# Patient Record
Sex: Female | Born: 1945
Health system: Southern US, Community
[De-identification: ages and names within clinical notes are randomized; demographics above are authoritative.]

## PROBLEM LIST (undated history)

## (undated) DIAGNOSIS — G43909 Migraine, unspecified, not intractable, without status migrainosus: Secondary | ICD-10-CM

## (undated) HISTORY — PX: COLONOSCOPY: SHX174

## (undated) HISTORY — PX: APPENDECTOMY: SHX54

## (undated) HISTORY — PX: TONSILLECTOMY: SUR1361

## (undated) HISTORY — PX: ABDOMINAL HYSTERECTOMY: SHX81

---

## 2005-08-10 ENCOUNTER — Ambulatory Visit: Payer: Self-pay | Admitting: Internal Medicine

## 2006-12-06 ENCOUNTER — Ambulatory Visit: Payer: Self-pay | Admitting: Internal Medicine

## 2008-01-30 ENCOUNTER — Ambulatory Visit: Payer: Self-pay | Admitting: Internal Medicine

## 2011-07-26 ENCOUNTER — Ambulatory Visit: Payer: Self-pay | Admitting: Internal Medicine

## 2012-07-26 ENCOUNTER — Ambulatory Visit: Payer: Self-pay | Admitting: Internal Medicine

## 2013-07-30 ENCOUNTER — Ambulatory Visit: Payer: Self-pay | Admitting: Internal Medicine

## 2013-09-10 ENCOUNTER — Ambulatory Visit: Payer: Self-pay | Admitting: Gastroenterology

## 2015-08-06 ENCOUNTER — Other Ambulatory Visit: Payer: Self-pay | Admitting: Internal Medicine

## 2015-08-06 DIAGNOSIS — Z1231 Encounter for screening mammogram for malignant neoplasm of breast: Secondary | ICD-10-CM

## 2015-08-18 ENCOUNTER — Ambulatory Visit
Admission: RE | Admit: 2015-08-18 | Discharge: 2015-08-18 | Disposition: A | Discharge status: Home / Self Care | Payer: PPO | Source: Ambulatory Visit | Attending: Internal Medicine | Admitting: Internal Medicine

## 2015-08-18 DIAGNOSIS — Z1231 Encounter for screening mammogram for malignant neoplasm of breast: Secondary | ICD-10-CM | POA: Diagnosis not present

## 2016-02-05 DIAGNOSIS — J40 Bronchitis, not specified as acute or chronic: Secondary | ICD-10-CM | POA: Diagnosis not present

## 2016-07-30 DIAGNOSIS — E782 Mixed hyperlipidemia: Secondary | ICD-10-CM | POA: Diagnosis not present

## 2016-08-06 DIAGNOSIS — L570 Actinic keratosis: Secondary | ICD-10-CM | POA: Diagnosis not present

## 2016-08-06 DIAGNOSIS — Z23 Encounter for immunization: Secondary | ICD-10-CM | POA: Diagnosis not present

## 2016-08-06 DIAGNOSIS — Z0000 Encounter for general adult medical examination without abnormal findings: Secondary | ICD-10-CM | POA: Diagnosis not present

## 2016-08-06 DIAGNOSIS — R5382 Chronic fatigue, unspecified: Secondary | ICD-10-CM | POA: Diagnosis not present

## 2016-08-06 DIAGNOSIS — M810 Age-related osteoporosis without current pathological fracture: Secondary | ICD-10-CM | POA: Diagnosis not present

## 2016-09-13 DIAGNOSIS — M810 Age-related osteoporosis without current pathological fracture: Secondary | ICD-10-CM | POA: Diagnosis not present

## 2016-10-18 DIAGNOSIS — J40 Bronchitis, not specified as acute or chronic: Secondary | ICD-10-CM | POA: Diagnosis not present

## 2017-06-27 DIAGNOSIS — H25813 Combined forms of age-related cataract, bilateral: Secondary | ICD-10-CM | POA: Diagnosis not present

## 2017-08-01 DIAGNOSIS — Z0000 Encounter for general adult medical examination without abnormal findings: Secondary | ICD-10-CM | POA: Diagnosis not present

## 2017-08-01 DIAGNOSIS — E782 Mixed hyperlipidemia: Secondary | ICD-10-CM | POA: Diagnosis not present

## 2017-08-11 DIAGNOSIS — Z79899 Other long term (current) drug therapy: Secondary | ICD-10-CM | POA: Diagnosis not present

## 2017-08-11 DIAGNOSIS — E782 Mixed hyperlipidemia: Secondary | ICD-10-CM | POA: Diagnosis not present

## 2017-08-11 DIAGNOSIS — Z23 Encounter for immunization: Secondary | ICD-10-CM | POA: Diagnosis not present

## 2017-08-11 DIAGNOSIS — Z0000 Encounter for general adult medical examination without abnormal findings: Secondary | ICD-10-CM | POA: Diagnosis not present

## 2017-10-27 DIAGNOSIS — H02831 Dermatochalasis of right upper eyelid: Secondary | ICD-10-CM | POA: Diagnosis not present

## 2018-07-10 DIAGNOSIS — W108XXA Fall (on) (from) other stairs and steps, initial encounter: Secondary | ICD-10-CM | POA: Diagnosis not present

## 2018-07-10 DIAGNOSIS — M19071 Primary osteoarthritis, right ankle and foot: Secondary | ICD-10-CM | POA: Diagnosis not present

## 2018-07-10 DIAGNOSIS — S99921A Unspecified injury of right foot, initial encounter: Secondary | ICD-10-CM | POA: Diagnosis not present

## 2018-07-11 DIAGNOSIS — S92424A Nondisplaced fracture of distal phalanx of right great toe, initial encounter for closed fracture: Secondary | ICD-10-CM | POA: Diagnosis not present

## 2018-08-09 DIAGNOSIS — Z79899 Other long term (current) drug therapy: Secondary | ICD-10-CM | POA: Diagnosis not present

## 2018-08-09 DIAGNOSIS — E782 Mixed hyperlipidemia: Secondary | ICD-10-CM | POA: Diagnosis not present

## 2018-08-16 DIAGNOSIS — E782 Mixed hyperlipidemia: Secondary | ICD-10-CM | POA: Diagnosis not present

## 2018-08-16 DIAGNOSIS — Z23 Encounter for immunization: Secondary | ICD-10-CM | POA: Diagnosis not present

## 2018-08-16 DIAGNOSIS — Z0000 Encounter for general adult medical examination without abnormal findings: Secondary | ICD-10-CM | POA: Diagnosis not present

## 2019-03-07 DIAGNOSIS — J0100 Acute maxillary sinusitis, unspecified: Secondary | ICD-10-CM | POA: Diagnosis not present

## 2019-08-13 DIAGNOSIS — E782 Mixed hyperlipidemia: Secondary | ICD-10-CM | POA: Diagnosis not present

## 2019-08-20 DIAGNOSIS — E782 Mixed hyperlipidemia: Secondary | ICD-10-CM | POA: Diagnosis not present

## 2019-08-20 DIAGNOSIS — Z23 Encounter for immunization: Secondary | ICD-10-CM | POA: Diagnosis not present

## 2019-08-20 DIAGNOSIS — Z0000 Encounter for general adult medical examination without abnormal findings: Secondary | ICD-10-CM | POA: Diagnosis not present

## 2019-09-10 DIAGNOSIS — Z20828 Contact with and (suspected) exposure to other viral communicable diseases: Secondary | ICD-10-CM | POA: Diagnosis not present

## 2019-11-16 DIAGNOSIS — U071 COVID-19: Secondary | ICD-10-CM

## 2019-11-16 HISTORY — DX: COVID-19: U07.1

## 2019-11-22 DIAGNOSIS — Z20828 Contact with and (suspected) exposure to other viral communicable diseases: Secondary | ICD-10-CM | POA: Diagnosis not present

## 2019-11-25 DIAGNOSIS — U071 COVID-19: Secondary | ICD-10-CM | POA: Diagnosis not present

## 2019-11-25 DIAGNOSIS — R509 Fever, unspecified: Secondary | ICD-10-CM | POA: Diagnosis not present

## 2020-01-23 DIAGNOSIS — J329 Chronic sinusitis, unspecified: Secondary | ICD-10-CM | POA: Diagnosis not present

## 2020-03-07 DIAGNOSIS — R05 Cough: Secondary | ICD-10-CM | POA: Diagnosis not present

## 2020-03-07 DIAGNOSIS — J45902 Unspecified asthma with status asthmaticus: Secondary | ICD-10-CM | POA: Diagnosis not present

## 2020-03-07 DIAGNOSIS — J40 Bronchitis, not specified as acute or chronic: Secondary | ICD-10-CM | POA: Diagnosis not present

## 2020-03-07 DIAGNOSIS — B349 Viral infection, unspecified: Secondary | ICD-10-CM | POA: Diagnosis not present

## 2020-03-17 DIAGNOSIS — G43009 Migraine without aura, not intractable, without status migrainosus: Secondary | ICD-10-CM | POA: Diagnosis not present

## 2020-03-17 DIAGNOSIS — R509 Fever, unspecified: Secondary | ICD-10-CM | POA: Diagnosis not present

## 2020-03-17 DIAGNOSIS — J189 Pneumonia, unspecified organism: Secondary | ICD-10-CM | POA: Diagnosis not present

## 2020-06-16 ENCOUNTER — Other Ambulatory Visit: Payer: Self-pay | Admitting: Internal Medicine

## 2020-06-16 DIAGNOSIS — Z1231 Encounter for screening mammogram for malignant neoplasm of breast: Secondary | ICD-10-CM

## 2020-07-07 ENCOUNTER — Ambulatory Visit
Admission: RE | Admit: 2020-07-07 | Discharge: 2020-07-07 | Disposition: A | Discharge status: Home / Self Care | Payer: PPO | Source: Ambulatory Visit | Attending: Internal Medicine | Admitting: Internal Medicine

## 2020-07-07 ENCOUNTER — Other Ambulatory Visit: Payer: Self-pay

## 2020-07-07 DIAGNOSIS — Z1231 Encounter for screening mammogram for malignant neoplasm of breast: Secondary | ICD-10-CM | POA: Insufficient documentation

## 2020-07-08 DIAGNOSIS — L821 Other seborrheic keratosis: Secondary | ICD-10-CM | POA: Diagnosis not present

## 2020-07-08 DIAGNOSIS — D2271 Melanocytic nevi of right lower limb, including hip: Secondary | ICD-10-CM | POA: Diagnosis not present

## 2020-07-08 DIAGNOSIS — L538 Other specified erythematous conditions: Secondary | ICD-10-CM | POA: Diagnosis not present

## 2020-07-08 DIAGNOSIS — D225 Melanocytic nevi of trunk: Secondary | ICD-10-CM | POA: Diagnosis not present

## 2020-07-08 DIAGNOSIS — D2272 Melanocytic nevi of left lower limb, including hip: Secondary | ICD-10-CM | POA: Diagnosis not present

## 2020-07-08 DIAGNOSIS — D2262 Melanocytic nevi of left upper limb, including shoulder: Secondary | ICD-10-CM | POA: Diagnosis not present

## 2020-07-08 DIAGNOSIS — D2261 Melanocytic nevi of right upper limb, including shoulder: Secondary | ICD-10-CM | POA: Diagnosis not present

## 2020-07-08 DIAGNOSIS — L820 Inflamed seborrheic keratosis: Secondary | ICD-10-CM | POA: Diagnosis not present

## 2020-08-13 DIAGNOSIS — E782 Mixed hyperlipidemia: Secondary | ICD-10-CM | POA: Diagnosis not present

## 2020-08-20 DIAGNOSIS — Z0000 Encounter for general adult medical examination without abnormal findings: Secondary | ICD-10-CM | POA: Diagnosis not present

## 2020-08-20 DIAGNOSIS — Z23 Encounter for immunization: Secondary | ICD-10-CM | POA: Diagnosis not present

## 2020-08-20 DIAGNOSIS — E782 Mixed hyperlipidemia: Secondary | ICD-10-CM | POA: Diagnosis not present

## 2020-10-29 DIAGNOSIS — J329 Chronic sinusitis, unspecified: Secondary | ICD-10-CM | POA: Diagnosis not present

## 2020-10-29 DIAGNOSIS — J3489 Other specified disorders of nose and nasal sinuses: Secondary | ICD-10-CM | POA: Diagnosis not present

## 2020-10-29 DIAGNOSIS — J309 Allergic rhinitis, unspecified: Secondary | ICD-10-CM | POA: Diagnosis not present

## 2020-11-05 DIAGNOSIS — J301 Allergic rhinitis due to pollen: Secondary | ICD-10-CM | POA: Diagnosis not present

## 2020-11-19 DIAGNOSIS — H903 Sensorineural hearing loss, bilateral: Secondary | ICD-10-CM | POA: Diagnosis not present

## 2020-11-19 DIAGNOSIS — J3489 Other specified disorders of nose and nasal sinuses: Secondary | ICD-10-CM | POA: Diagnosis not present

## 2020-11-19 DIAGNOSIS — J329 Chronic sinusitis, unspecified: Secondary | ICD-10-CM | POA: Diagnosis not present

## 2020-11-27 ENCOUNTER — Encounter: Payer: Self-pay | Admitting: Unknown Physician Specialty

## 2020-11-27 ENCOUNTER — Other Ambulatory Visit: Payer: Self-pay

## 2020-12-02 NOTE — Discharge Instructions (Signed)
[facility] ENDOSCOPIC SINUS SURGERY [facility]  What is Functional Endoscopic Sinus Surgery?  The Surgery involves making the natural openings of the sinuses larger by removing the bony partitions that separate the sinuses from the nasal cavity.  The natural sinus lining is preserved as much as possible to allow the sinuses to resume normal function after the surgery.  In some patients nasal polyps (excessively swollen lining of the sinuses) may be removed to relieve obstruction of the sinus openings.  The surgery is performed through the nose using lighted scopes, which eliminates the need for incisions on the face.  A septoplasty is a different procedure which is sometimes performed with sinus surgery.  It involves straightening the boy partition that separates the two sides of your nose.  A crooked or deviated septum may need repair if is obstructing the sinuses or nasal airflow.  Turbinate reduction is also often performed during sinus surgery.  The turbinates are bony proturberances from the side walls of the nose which swell and can obstruct the nose in patients with sinus and allergy problems.  Their size can be surgically reduced to help relieve nasal obstruction.  What Can Sinus Surgery Do For Me?  Sinus surgery can reduce the frequency of sinus infections requiring antibiotic treatment.  This can provide improvement in nasal congestion, post-nasal drainage, facial pressure and nasal obstruction.  Surgery will NOT prevent you from ever having an infection again, so it usually only for patients who get infections 4 or more times yearly requiring antibiotics, or for infections that do not clear with antibiotics.  It will not cure nasal allergies, so patients with allergies may still require medication to treat their allergies after surgery. Surgery may improve headaches related to sinusitis, however, some people will continue to  require medication to control sinus headaches related to allergies.  Surgery will do nothing for other forms of headache (migraine, tension or cluster).  What Are the Risks of Endoscopic Sinus Surgery?  Current techniques allow surgery to be performed safely with little risk, however, there are rare complications that patients should be aware of.  Because the sinuses are located around the eyes, there is risk of eye injury, including blindness, though again, this would be quite rare. This is usually a result of bleeding behind the eye during surgery, which puts the vision oat risk, though there are treatments to protect the vision and prevent permanent disrupted by surgery causing a leak of the spinal fluid that surrounds the brain.  More serious complications would include bleeding inside the brain cavity or damage to the brain.  Again, all of these complications are uncommon, and spinal fluid leaks can be safely managed surgically if they occur.  The most common complication of sinus surgery is bleeding from the nose, which may require packing or cauterization of the nose.  Continued sinus have polyps may experience recurrence of the polyps requiring revision surgery.  Alterations of sense of smell or injury to the tear ducts are also rare complications.   What is the Surgery Like, and what is the Recovery?  The Surgery usually takes a couple of hours to perform, and is usually performed under a general anesthetic (completely asleep).  Patients are usually discharged home after a couple of hours.  Sometimes during surgery it is necessary to pack the nose to control bleeding, and the packing is left in place for 24 - 48 hours, and removed by your surgeon.    If a septoplasty was performed during the procedure, there is often a splint placed which must be removed after 5-7 days.   Discomfort: Pain is usually mild to moderate, and can be controlled by prescription pain medication or acetaminophen (Tylenol).   Aspirin, Ibuprofen (Advil, Motrin), or Naprosyn (Aleve) should be avoided, as they can cause increased bleeding.  Most patients feel sinus pressure like they have a bad head cold for several days.  Sleeping with your head elevated can help reduce swelling and facial pressure, as can ice packs over the face.  A humidifier may be helpful to keep the mucous and blood from drying in the nose.   Diet: There are no specific diet restrictions, however, you should generally start with clear liquids and a light diet of bland foods because the anesthetic can cause some nausea.  Advance your diet depending on how your stomach feels.  Taking your pain medication with food will often help reduce stomach upset which pain medications can cause.  Nasal Saline Irrigation: It is important to remove blood clots and dried mucous from the nose as it is healing.  This is done by having you irrigate the nose at least 3 - 4 times daily with a salt water solution.  We recommend using NeilMed Sinus Rinse (available at the drug store).  Fill the squeeze bottle with the solution, bend over a sink, and insert the tip of the squeeze bottle into the nose  of an inch.  Point the tip of the squeeze bottle towards the inside corner of the eye on the same side your irrigating.  Squeeze the bottle and gently irrigate the nose.  If you bend forward as you do this, most of the fluid will flow back out of the nose, instead of down your throat.   The solution should be warm, near body temperature, when you irrigate.   Each time you irrigate, you should use a full squeeze bottle.   Note that if you are instructed to use Nasal Steroid Sprays at any time after your surgery, irrigate with saline BEFORE using the steroid spray, so you do not wash it all out of the nose. Another product, Nasal Saline Gel (such as AYR Nasal Saline Gel) can be applied in each nostril 3 - 4 times daily to moisture the nose and reduce scabbing or crusting.  Bleeding:   Bloody drainage from the nose can be expected for several days, and patients are instructed to irrigate their nose frequently with salt water to help remove mucous and blood clots.  The drainage may be dark red or brown, though some fresh blood may be seen intermittently, especially after irrigation.  Do not blow you nose, as bleeding may occur. If you must sneeze, keep your mouth open to allow air to escape through your mouth.  If heavy bleeding occurs: Irrigate the nose with saline to rinse out clots, then spray the nose 3 - 4 times with Afrin Nasal Decongestant Spray.  The spray will constrict the blood vessels to slow bleeding.  Pinch the lower half of your nose shut to apply pressure, and lay down with your head elevated.  Ice packs over the nose may help as well. If bleeding persists despite these measures, you should notify your doctor.  Do not use the Afrin routinely to control nasal congestion after surgery, as it can result in worsening congestion and may affect healing.     Activity: Return to work varies among patients. Most patients will be   out of work at least 5 - 7 days to recover.  Patient may return to work after they are off of narcotic pain medication, and feeling well enough to perform the functions of their job.  Patients must avoid heavy lifting (over 10 pounds) or strenuous physical for 2 weeks after surgery, so your employer may need to assign you to light duty, or keep you out of work longer if light duty is not possible.  NOTE: you should not drive, operate dangerous machinery, do any mentally demanding tasks or make any important legal or financial decisions while on narcotic pain medication and recovering from the general anesthetic.    Call Your Doctor Immediately if You Have Any of the Following: 1. Bleeding that you cannot control with the above measures 2. Loss of vision, double vision, bulging of the eye or black eyes. 3. Fever over 101 degrees 4. Neck stiffness with  severe headache, fever, nausea and change in mental state. You are always encourage to call anytime with concerns, however, please call with requests for pain medication refills during office hours.  Office Endoscopy: During follow-up visits your doctor will remove any packing or splints that may have been placed and evaluate and clean your sinuses endoscopically.  Topical anesthetic will be used to make this as comfortable as possible, though you may want to take your pain medication prior to the visit.  How often this will need to be done varies from patient to patient.  After complete recovery from the surgery, you may need follow-up endoscopy from time to time, particularly if there is concern of recurrent infection or nasal polyps.  General Anesthesia, Adult, Care After This sheet gives you information about how to care for yourself after your procedure. Your health care provider may also give you more specific instructions. If you have problems or questions, contact your health care provider. What can I expect after the procedure? After the procedure, the following side effects are common:  Pain or discomfort at the IV site.  Nausea.  Vomiting.  Sore throat.  Trouble concentrating.  Feeling cold or chills.  Feeling weak or tired.  Sleepiness and fatigue.  Soreness and body aches. These side effects can affect parts of the body that were not involved in surgery. Follow these instructions at home: For the time period you were told by your health care provider:  Rest.  Do not participate in activities where you could fall or become injured.  Do not drive or use machinery.  Do not drink alcohol.  Do not take sleeping pills or medicines that cause drowsiness.  Do not make important decisions or sign legal documents.  Do not take care of children on your own.   Eating and drinking  Follow any instructions from your health care provider about eating or drinking  restrictions.  When you feel hungry, start by eating small amounts of foods that are soft and easy to digest (bland), such as toast. Gradually return to your regular diet.  Drink enough fluid to keep your urine pale yellow.  If you vomit, rehydrate by drinking water, juice, or clear broth. General instructions  If you have sleep apnea, surgery and certain medicines can increase your risk for breathing problems. Follow instructions from your health care provider about wearing your sleep device: ? Anytime you are sleeping, including during daytime naps. ? While taking prescription pain medicines, sleeping medicines, or medicines that make you drowsy.  Have a responsible adult stay with you for the  time you are told. It is important to have someone help care for you until you are awake and alert.  Return to your normal activities as told by your health care provider. Ask your health care provider what activities are safe for you.  Take over-the-counter and prescription medicines only as told by your health care provider.  If you smoke, do not smoke without supervision.  Keep all follow-up visits as told by your health care provider. This is important. Contact a health care provider if:  You have nausea or vomiting that does not get better with medicine.  You cannot eat or drink without vomiting.  You have pain that does not get better with medicine.  You are unable to pass urine.  You develop a skin rash.  You have a fever.  You have redness around your IV site that gets worse. Get help right away if:  You have difficulty breathing.  You have chest pain.  You have blood in your urine or stool, or you vomit blood. Summary  After the procedure, it is common to have a sore throat or nausea. It is also common to feel tired.  Have a responsible adult stay with you for the time you are told. It is important to have someone help care for you until you are awake and  alert.  When you feel hungry, start by eating small amounts of foods that are soft and easy to digest (bland), such as toast. Gradually return to your regular diet.  Drink enough fluid to keep your urine pale yellow.  Return to your normal activities as told by your health care provider. Ask your health care provider what activities are safe for you. This information is not intended to replace advice given to you by your health care provider. Make sure you discuss any questions you have with your health care provider. Document Revised: 07/17/2020 Document Reviewed: 02/14/2020 Elsevier Patient Education  2021 [employer].

## 2020-12-03 ENCOUNTER — Other Ambulatory Visit: Payer: Self-pay

## 2020-12-03 ENCOUNTER — Other Ambulatory Visit
Admission: RE | Admit: 2020-12-03 | Discharge: 2020-12-03 | Disposition: A | Discharge status: Home / Self Care | Payer: PPO | Source: Ambulatory Visit | Attending: Unknown Physician Specialty | Admitting: Unknown Physician Specialty

## 2020-12-03 DIAGNOSIS — Z01812 Encounter for preprocedural laboratory examination: Secondary | ICD-10-CM | POA: Diagnosis not present

## 2020-12-03 DIAGNOSIS — Z20822 Contact with and (suspected) exposure to covid-19: Secondary | ICD-10-CM | POA: Insufficient documentation

## 2020-12-03 LAB — SARS CORONAVIRUS 2 (TAT 6-24 HRS): SARS Coronavirus 2: NEGATIVE

## 2020-12-05 ENCOUNTER — Encounter
Admission: RE | Disposition: A | Discharge status: Home / Self Care | Payer: Self-pay | Source: Home / Self Care | Attending: Unknown Physician Specialty

## 2020-12-05 ENCOUNTER — Encounter: Payer: Self-pay | Admitting: Unknown Physician Specialty

## 2020-12-05 ENCOUNTER — Ambulatory Visit
Admission: RE | Admit: 2020-12-05 | Discharge: 2020-12-05 | Disposition: A | Discharge status: Home / Self Care | Payer: PPO | Attending: Unknown Physician Specialty | Admitting: Unknown Physician Specialty

## 2020-12-05 ENCOUNTER — Ambulatory Visit: Payer: PPO | Admitting: Anesthesiology

## 2020-12-05 ENCOUNTER — Other Ambulatory Visit: Payer: Self-pay

## 2020-12-05 DIAGNOSIS — J3489 Other specified disorders of nose and nasal sinuses: Secondary | ICD-10-CM | POA: Diagnosis not present

## 2020-12-05 DIAGNOSIS — J343 Hypertrophy of nasal turbinates: Secondary | ICD-10-CM | POA: Insufficient documentation

## 2020-12-05 DIAGNOSIS — J342 Deviated nasal septum: Secondary | ICD-10-CM | POA: Insufficient documentation

## 2020-12-05 HISTORY — PX: NASAL SEPTOPLASTY W/ TURBINOPLASTY: SHX2070

## 2020-12-05 HISTORY — DX: Migraine, unspecified, not intractable, without status migrainosus: G43.909

## 2020-12-05 SURGERY — SEPTOPLASTY, NOSE, WITH NASAL TURBINATE REDUCTION
Anesthesia: General | Site: Nose | Laterality: Bilateral

## 2020-12-05 MED ORDER — SULFAMETHOXAZOLE-TRIMETHOPRIM 800-160 MG PO TABS: 1.0000 | ORAL_TABLET | Freq: Two times a day (BID) | ORAL | 0 refills | Status: AC

## 2020-12-05 MED ORDER — MIDAZOLAM HCL 5 MG/5ML IJ SOLN
INTRAMUSCULAR | Status: DC | PRN
  Administered 2020-12-05: 1 mg via INTRAVENOUS

## 2020-12-05 MED ORDER — FENTANYL CITRATE (PF) 100 MCG/2ML IJ SOLN
25.0000 ug | INTRAMUSCULAR | Status: DC | PRN
  Administered 2020-12-05 (×2): 25 ug via INTRAVENOUS

## 2020-12-05 MED ORDER — GLYCOPYRROLATE 0.2 MG/ML IJ SOLN
INTRAMUSCULAR | Status: DC | PRN
  Administered 2020-12-05: .1 mg via INTRAVENOUS

## 2020-12-05 MED ORDER — EPHEDRINE SULFATE 50 MG/ML IJ SOLN
INTRAMUSCULAR | Status: DC | PRN
  Administered 2020-12-05 (×2): 5 mg via INTRAVENOUS

## 2020-12-05 MED ORDER — LIDOCAINE HCL (CARDIAC) PF 100 MG/5ML IV SOSY
PREFILLED_SYRINGE | INTRAVENOUS | Status: DC | PRN
  Administered 2020-12-05: 40 mg via INTRAVENOUS

## 2020-12-05 MED ORDER — ACETAMINOPHEN 10 MG/ML IV SOLN
1000.0000 mg | Freq: Once | INTRAVENOUS | Status: AC
  Administered 2020-12-05: 1000 mg via INTRAVENOUS

## 2020-12-05 MED ORDER — PROPOFOL 10 MG/ML IV BOLUS
INTRAVENOUS | Status: DC | PRN
  Administered 2020-12-05: 130 mg via INTRAVENOUS

## 2020-12-05 MED ORDER — OXYCODONE HCL 5 MG PO TABS: 5.0000 mg | ORAL_TABLET | Freq: Once | ORAL | Status: AC | PRN

## 2020-12-05 MED ORDER — FENTANYL CITRATE (PF) 100 MCG/2ML IJ SOLN
INTRAMUSCULAR | Status: DC | PRN
  Administered 2020-12-05 (×2): 25 ug via INTRAVENOUS
  Administered 2020-12-05: 50 ug via INTRAVENOUS

## 2020-12-05 MED ORDER — SUCCINYLCHOLINE CHLORIDE 20 MG/ML IJ SOLN
INTRAMUSCULAR | Status: DC | PRN
  Administered 2020-12-05: 80 mg via INTRAVENOUS

## 2020-12-05 MED ORDER — ONDANSETRON HCL 4 MG/2ML IJ SOLN
INTRAMUSCULAR | Status: DC | PRN
  Administered 2020-12-05: 4 mg via INTRAVENOUS

## 2020-12-05 MED ORDER — DEXAMETHASONE SODIUM PHOSPHATE 4 MG/ML IJ SOLN
INTRAMUSCULAR | Status: DC | PRN
  Administered 2020-12-05: 10 mg via INTRAVENOUS

## 2020-12-05 MED ORDER — BACITRACIN 500 UNIT/GM EX OINT
TOPICAL_OINTMENT | CUTANEOUS | Status: DC | PRN
  Administered 2020-12-05: 1 via TOPICAL

## 2020-12-05 MED ORDER — OXYCODONE HCL 5 MG/5ML PO SOLN
5.0000 mg | Freq: Once | ORAL | Status: AC | PRN
  Administered 2020-12-05: 5 mg via ORAL

## 2020-12-05 MED ORDER — PHENYLEPHRINE HCL 0.5 % NA SOLN
NASAL | Status: DC | PRN
  Administered 2020-12-05: 30 mL via TOPICAL

## 2020-12-05 MED ORDER — ONDANSETRON HCL 4 MG/2ML IJ SOLN: 4.0000 mg | Freq: Once | INTRAMUSCULAR | Status: DC | PRN

## 2020-12-05 MED ORDER — OXYMETAZOLINE HCL 0.05 % NA SOLN: 6.0000 | Freq: Two times a day (BID) | NASAL | Status: DC

## 2020-12-05 MED ORDER — LIDOCAINE-EPINEPHRINE 1 %-1:100000 IJ SOLN
INTRAMUSCULAR | Status: DC | PRN
  Administered 2020-12-05: 12 mL

## 2020-12-05 MED ORDER — LACTATED RINGERS IV SOLN: INTRAVENOUS | Status: DC

## 2020-12-05 MED ORDER — HYDROCODONE-ACETAMINOPHEN 5-325 MG PO TABS: 1.0000 | ORAL_TABLET | Freq: Four times a day (QID) | ORAL | 0 refills | Status: AC | PRN

## 2020-12-05 SURGICAL SUPPLY — 24 items
COAG SUCT 10F 3.5MM HAND CTRL (MISCELLANEOUS) ×2 IMPLANT
DRAPE HEAD BAR (DRAPES) ×2 IMPLANT
DRESSING NASL FOAM PST OP SINU (MISCELLANEOUS) IMPLANT
DRSG NASAL FOAM POST OP SINU (MISCELLANEOUS) ×4
ELECT REM PT RETURN 9FT ADLT (ELECTROSURGICAL) ×2
ELECTRODE REM PT RTRN 9FT ADLT (ELECTROSURGICAL) ×1 IMPLANT
GLOVE BIO SURGEON STRL SZ7.5 (GLOVE) ×4 IMPLANT
HANDLE YANKAUER SUCT BULB TIP (MISCELLANEOUS) ×2 IMPLANT
KIT TURNOVER KIT A (KITS) ×2 IMPLANT
NDL HYPO 25GX1X1/2 BEV (NEEDLE) ×1 IMPLANT
NEEDLE HYPO 25GX1X1/2 BEV (NEEDLE) ×2 IMPLANT
PACK ENT CUSTOM (PACKS) ×2 IMPLANT
SPLINT NASAL SEPTAL BLV .25 LG (MISCELLANEOUS) IMPLANT
SPLINT NASAL SEPTAL BLV .50 ST (MISCELLANEOUS) ×1 IMPLANT
SPONGE NEURO XRAY DETECT 1X3 (DISPOSABLE) ×2 IMPLANT
STRAP BODY AND KNEE 60X3 (MISCELLANEOUS) ×2 IMPLANT
SUT CHROMIC 3-0 (SUTURE) ×2
SUT CHROMIC 3-0 KS 27XMFL CR (SUTURE) ×1
SUT ETHILON 3-0 KS 30 BLK (SUTURE) ×2 IMPLANT
SUT PLAIN GUT 4-0 (SUTURE) IMPLANT
SUTURE CHRMC 3-0 KS 27XMFL CR (SUTURE) ×1 IMPLANT
SYR 10ML LL (SYRINGE) ×2 IMPLANT
TOWEL OR 17X26 4PK STRL BLUE (TOWEL DISPOSABLE) ×2 IMPLANT
WATER STERILE IRR 250ML POUR (IV SOLUTION) ×2 IMPLANT

## 2020-12-05 NOTE — Anesthesia Preprocedure Evaluation (Signed)
Anesthesia Evaluation  Patient identified by MRN, date of birth, ID band Patient awake    Reviewed: Allergy & Precautions, H&P , NPO status , Patient's Chart, lab work & pertinent test results  Airway Mallampati: II  TM Distance: >3 FB Neck ROM: full    Dental no notable dental hx.    Pulmonary    Pulmonary exam normal breath sounds clear to auscultation       Cardiovascular Normal cardiovascular exam Rhythm:regular Rate:Normal     Neuro/Psych  Headaches,    GI/Hepatic   Endo/Other    Renal/GU      Musculoskeletal   Abdominal   Peds  Hematology   Anesthesia Other Findings   Reproductive/Obstetrics                             Anesthesia Physical Anesthesia Plan  ASA: II  Anesthesia Plan: General ETT   Post-op Pain Management:    Induction:   PONV Risk Score and Plan: 3 and Treatment may vary due to age or medical condition, Ondansetron, Dexamethasone and Midazolam  Airway Management Planned:   Additional Equipment:   Intra-op Plan:   Post-operative Plan:   Informed Consent: I have reviewed the patients History and Physical, chart, labs and discussed the procedure including the risks, benefits and alternatives for the proposed anesthesia with the patient or authorized representative who has indicated his/her understanding and acceptance.     Dental Advisory Given  Plan Discussed with: CRNA  Anesthesia Plan Comments:         Anesthesia Quick Evaluation

## 2020-12-05 NOTE — Anesthesia Postprocedure Evaluation (Signed)
Anesthesia Post Note  Patient: Debbie Simpson  Procedure(s) Performed: NASAL SEPTOPLASTY WITH TURBINATE REDUCTION (Bilateral Nose)     Patient location during evaluation: PACU Anesthesia Type: General Level of consciousness: awake and alert and oriented Pain management: satisfactory to patient Vital Signs Assessment: post-procedure vital signs reviewed and stable Respiratory status: spontaneous breathing, nonlabored ventilation and respiratory function stable Cardiovascular status: blood pressure returned to baseline and stable Postop Assessment: Adequate PO intake and No signs of nausea or vomiting Anesthetic complications: no   No complications documented.  Raliegh Ip

## 2020-12-05 NOTE — Transfer of Care (Signed)
Immediate Anesthesia Transfer of Care Note  Patient: Debbie Simpson  Procedure(s) Performed: NASAL SEPTOPLASTY WITH TURBINATE REDUCTION (Bilateral Nose)  Patient Location: PACU  Anesthesia Type: General ETT  Level of Consciousness: awake, alert  and patient cooperative  Airway and Oxygen Therapy: Patient Spontanous Breathing and Patient connected to supplemental oxygen  Post-op Assessment: Post-op Vital signs reviewed, Patient's Cardiovascular Status Stable, Respiratory Function Stable, Patent Airway and No signs of Nausea or vomiting  Post-op Vital Signs: Reviewed and stable  Complications: No complications documented.

## 2020-12-05 NOTE — H&P (Signed)
The patient's history has been reviewed, patient examined, no change in status, stable for surgery.  Questions were answered to the patients satisfaction.  

## 2020-12-05 NOTE — Anesthesia Procedure Notes (Signed)
Procedure Name: Intubation Date/Time: 12/05/2020 9:14 AM Performed by: Mayme Genta, CRNA Pre-anesthesia Checklist: Patient identified, Emergency Drugs available, Suction available, Patient being monitored and Timeout performed Patient Re-evaluated:Patient Re-evaluated prior to induction Oxygen Delivery Method: Circle system utilized Preoxygenation: Pre-oxygenation with 100% oxygen Induction Type: IV induction Ventilation: Mask ventilation without difficulty Laryngoscope Size: Miller and 2 Grade View: Grade I Tube type: Oral Rae Tube size: 7.0 mm Number of attempts: 1 Placement Confirmation: ETT inserted through vocal cords under direct vision,  positive ETCO2 and breath sounds checked- equal and bilateral Tube secured with: Tape Dental Injury: Teeth and Oropharynx as per pre-operative assessment

## 2020-12-05 NOTE — Op Note (Signed)
PREOPERATIVE DIAGNOSIS:  Chronic nasal obstruction.  POSTOPERATIVE DIAGNOSIS:  Chronic nasal obstruction.  SURGEON:  Roena Malady, M.D.  NAME OF PROCEDURE:  1. Nasal septoplasty. 2. Submucous resection of inferior turbinates.  OPERATIVE FINDINGS:  Severe nasal septal deformity, hypertrophy of the inferior turbinates.   DESCRIPTION OF THE PROCEDURE:  Debbie Simpson was identified in the holding area and taken to the operating room and placed in the supine position.  After general endotracheal anesthesia was induced, the table was turned 45 degrees and the patient was placed in a semi-Fowler position.  The nose was then topically anesthetized with Lidocaine, cotton pledgets were placed within each nostril. After approximately 5 minutes, this was removed at which time a local anesthetic of 1% Lidocaine 1:100,000 units of Epinephrine was used to inject the inferior turbinates in the nasal septum. A total of 12 ml was used. Examination of the nose showed a severe right nasal septal deformity and tremendous hypertrophied inferior turbinate.  Beginning on the right hand side a hemitransfixion incision was then created on the leading edge of the septum on the right.  A subperichondrial plane was elevated posteriorly on the left and taken back to the perpendicular plate of the ethmoid where subperiosteal plane was elevated posteriorly on the left. A large septal spur was identified on the right hand side impacting on the inferior turbinate.  An inferior rim of cartilage was removed anteriorly with care taken to leave an anterior strut to prevent nasal collapse. With this strut removed the perpendicular plate of the ethmoid was separated from the quadrangular cartilage. The large septal spur was removed.  The septum was then replaced in the midline. Reinspection through each nostril showed excellent reduction of the septal deformity. A left posterior inferior fenestration was then created to allow hematoma  drainage.  With the septoplasty completed, beginning on the left-hand side, a 15 blade was used to incise along the inferior edge of the inferior turbinate. A superior laterally based flap was then elevated. The underlying conchal bone of mucosa was excised using Knight scissors. The flap was then laid back over the turbinate stump and cauterized using suction cautery. In a similar fashion the submucous resection was performed on the right.  With the submucous resection completed bilaterally and no active bleeding, the hemitransfixion incision was then closed using two interrupted 3-0 chromic sutures.  Plastic nasal septal splints were placed within each nostril and affixed to the septum using a 3-0 nylon suture. Stammberger was then used beneath each inferior turbinate for hemostasis.    The patient tolerated the procedure well, was returned to anesthesia, extubated in the operating room, and taken to the recovery room in stable condition.    CULTURES:  None.  SPECIMENS:  None.  ESTIMATED BLOOD LOSS:  25 cc.  Roena Malady  12/05/2020  9:57 AM

## 2020-12-08 ENCOUNTER — Encounter: Payer: Self-pay | Admitting: Unknown Physician Specialty

## 2020-12-08 IMAGING — MG DIGITAL SCREENING BILAT W/ TOMO W/ CAD
8 series · 9 of 24 positions shown · non-contrast
Comparison: Previous exam(s).

CLINICAL DATA: Screening.

EXAM:
DIGITAL SCREENING BILATERAL MAMMOGRAM WITH TOMO AND CAD

[L MLO synth-2D]
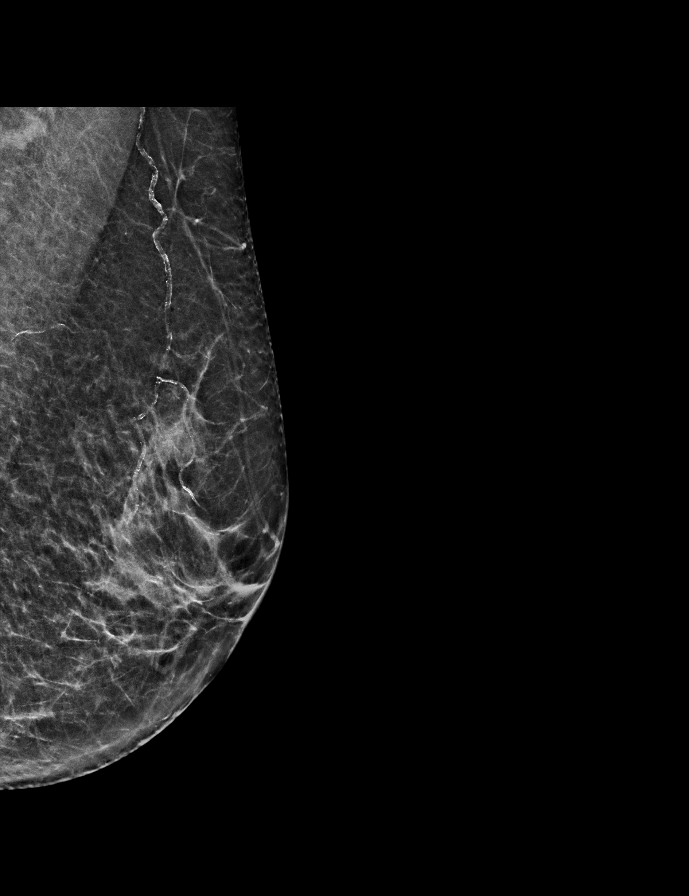

[R CC synth-2D]
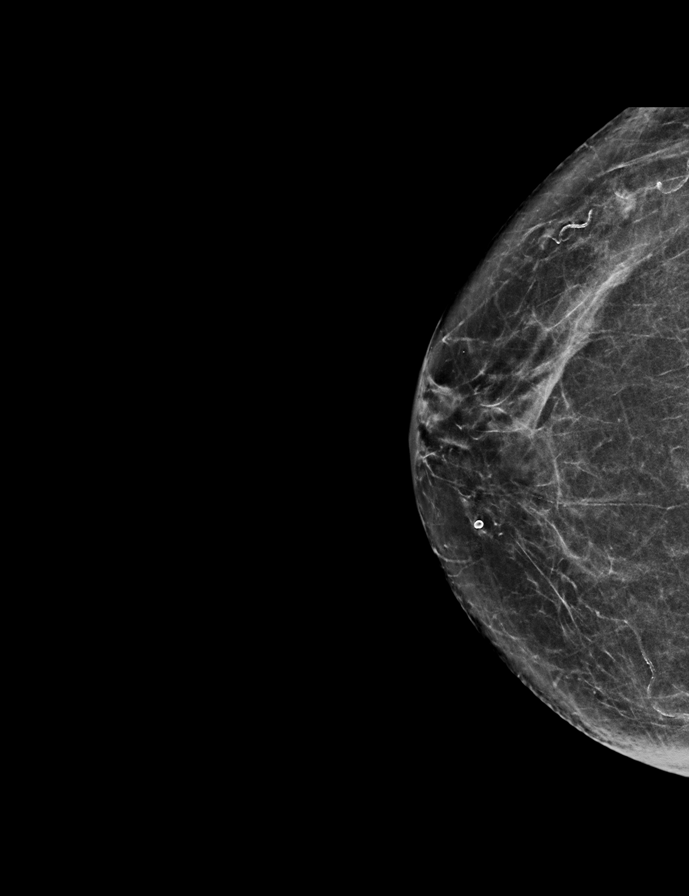

[L CC synth-2D]
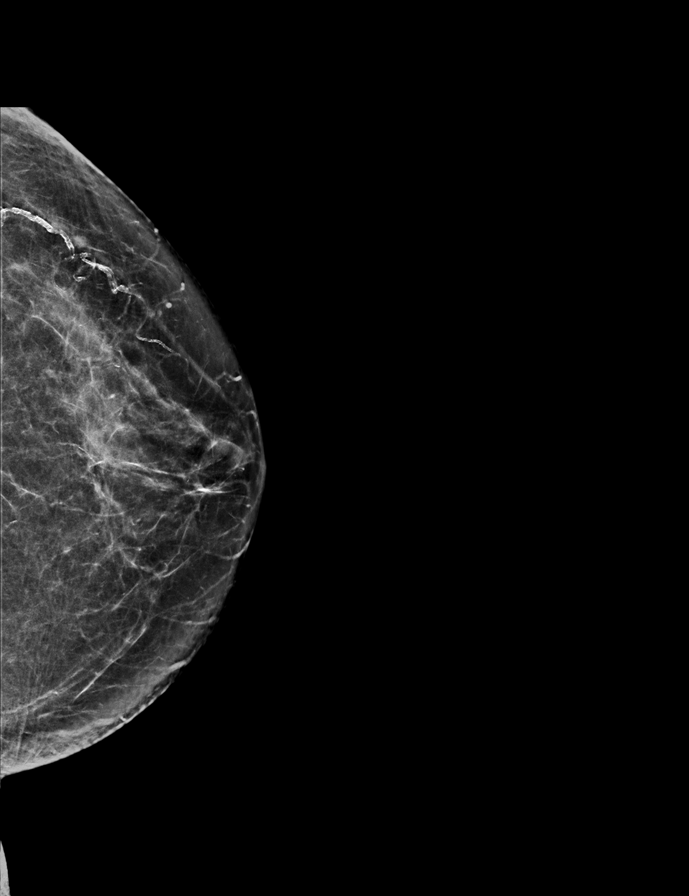

[R MLO synth-2D]
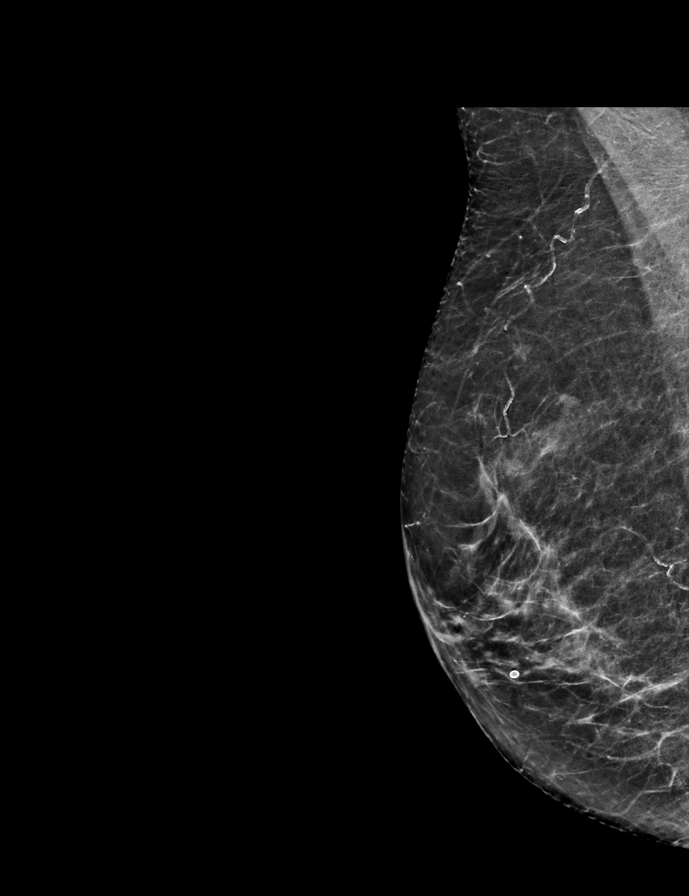

[R MLO tomo · 2 of 59 frames shown]
[frame 20/59]
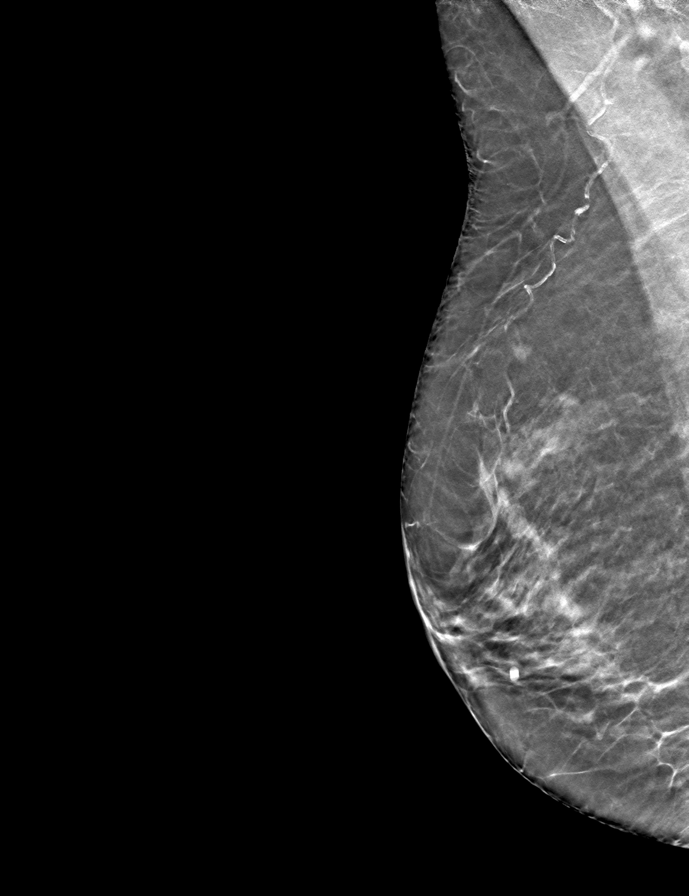
[frame 30/59]
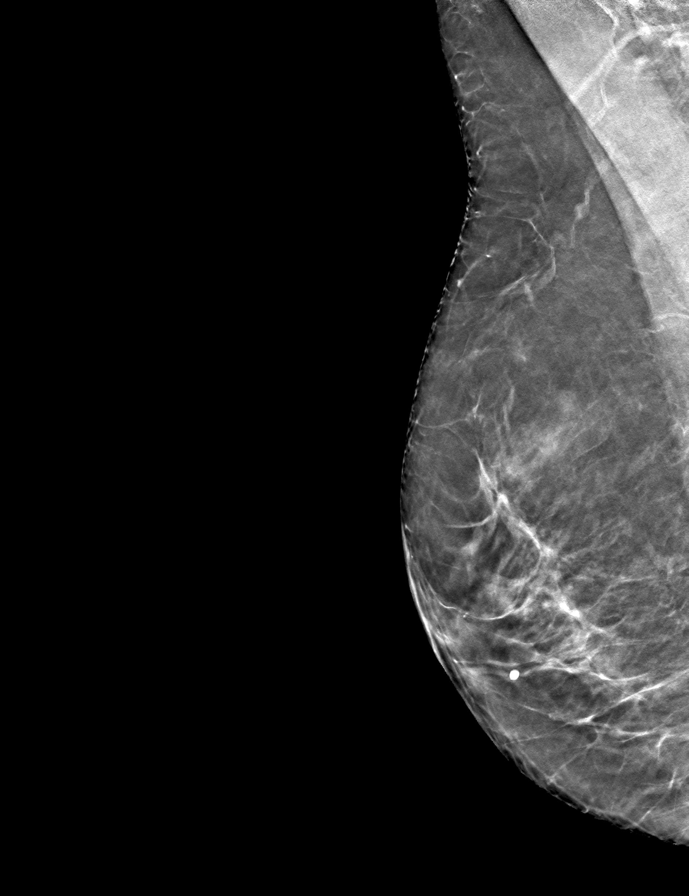

[L CC tomo · tomo slice 31/60.0]
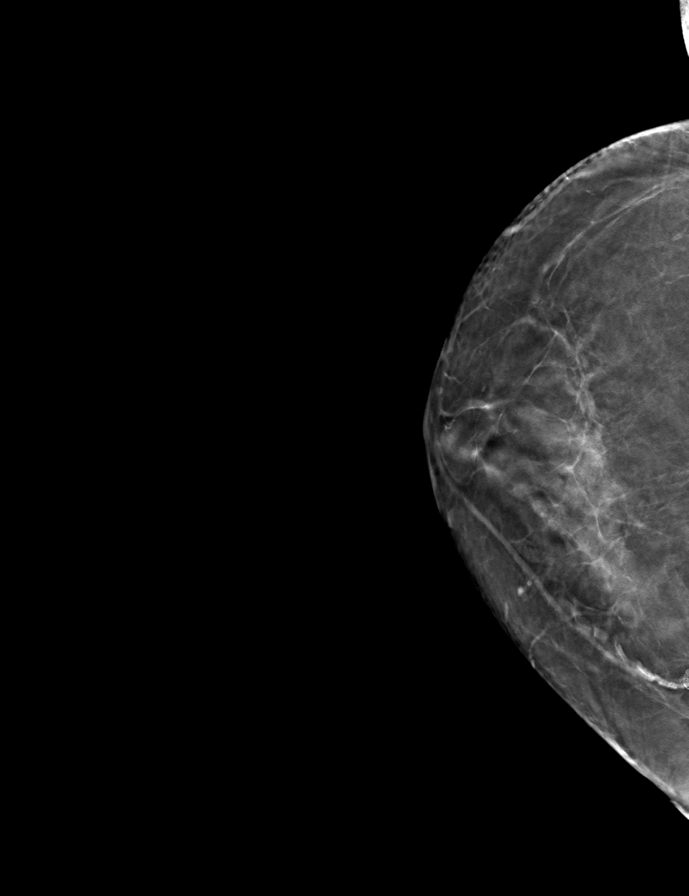

[L MLO tomo · tomo slice 27/54.0]
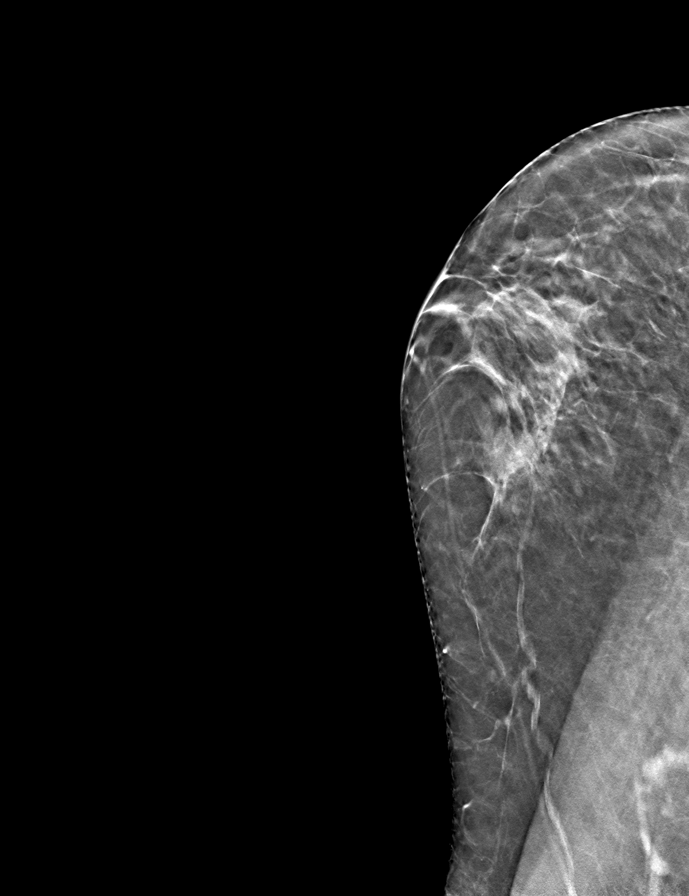

[R CC tomo · tomo slice 31/61.0]
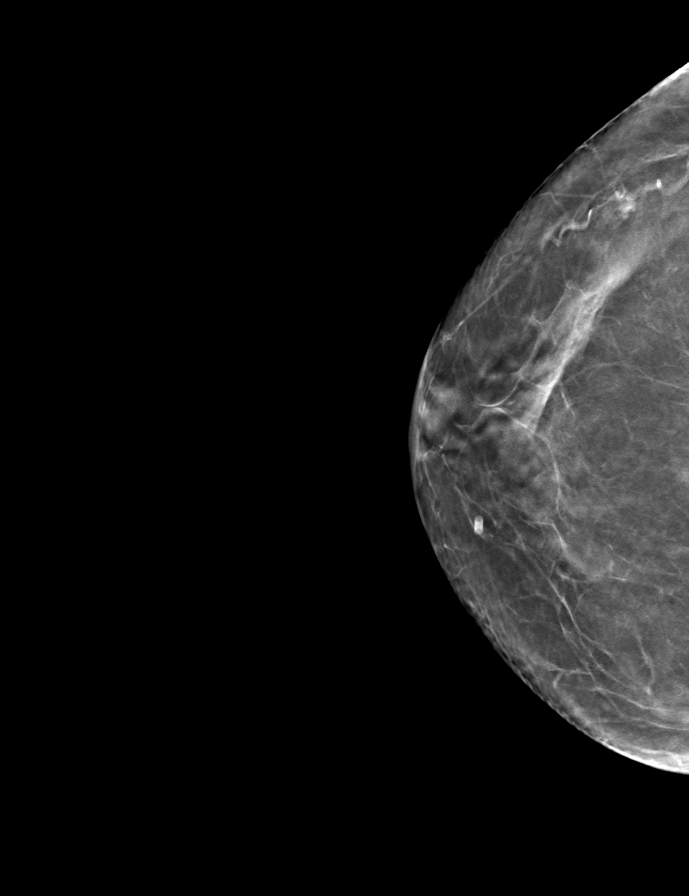

[9 of 24 positions shown; findings below may reference images not displayed]

ACR Breast Density Category c: The breast tissue is heterogeneously
dense, which may obscure small masses.
FINDINGS: There are no findings suspicious for malignancy. Images were
processed with CAD.
IMPRESSION: No mammographic evidence of malignancy. A result letter of this
screening mammogram will be mailed directly to the patient.

RECOMMENDATION:
Screening mammogram in one year. (Code:[id number])

BI-RADS CATEGORY  1: Negative.

## 2021-06-04 DIAGNOSIS — J340 Abscess, furuncle and carbuncle of nose: Secondary | ICD-10-CM | POA: Diagnosis not present

## 2021-06-04 DIAGNOSIS — Z48813 Encounter for surgical aftercare following surgery on the respiratory system: Secondary | ICD-10-CM | POA: Diagnosis not present

## 2021-08-13 DIAGNOSIS — E782 Mixed hyperlipidemia: Secondary | ICD-10-CM | POA: Diagnosis not present

## 2021-08-21 DIAGNOSIS — E538 Deficiency of other specified B group vitamins: Secondary | ICD-10-CM | POA: Diagnosis not present

## 2021-08-21 DIAGNOSIS — E782 Mixed hyperlipidemia: Secondary | ICD-10-CM | POA: Diagnosis not present

## 2021-08-21 DIAGNOSIS — Z0000 Encounter for general adult medical examination without abnormal findings: Secondary | ICD-10-CM | POA: Diagnosis not present

## 2021-08-21 DIAGNOSIS — Z23 Encounter for immunization: Secondary | ICD-10-CM | POA: Diagnosis not present

## 2021-11-30 DIAGNOSIS — J340 Abscess, furuncle and carbuncle of nose: Secondary | ICD-10-CM | POA: Diagnosis not present

## 2021-11-30 DIAGNOSIS — R0981 Nasal congestion: Secondary | ICD-10-CM | POA: Diagnosis not present

## 2021-12-23 DIAGNOSIS — H6981 Other specified disorders of Eustachian tube, right ear: Secondary | ICD-10-CM | POA: Diagnosis not present

## 2021-12-23 DIAGNOSIS — J340 Abscess, furuncle and carbuncle of nose: Secondary | ICD-10-CM | POA: Diagnosis not present

## 2022-08-16 DIAGNOSIS — E538 Deficiency of other specified B group vitamins: Secondary | ICD-10-CM | POA: Diagnosis not present

## 2022-08-16 DIAGNOSIS — E782 Mixed hyperlipidemia: Secondary | ICD-10-CM | POA: Diagnosis not present

## 2022-08-23 DIAGNOSIS — E782 Mixed hyperlipidemia: Secondary | ICD-10-CM | POA: Diagnosis not present

## 2022-08-23 DIAGNOSIS — Z0000 Encounter for general adult medical examination without abnormal findings: Secondary | ICD-10-CM | POA: Diagnosis not present

## 2022-08-23 DIAGNOSIS — M818 Other osteoporosis without current pathological fracture: Secondary | ICD-10-CM | POA: Diagnosis not present

## 2022-08-23 DIAGNOSIS — Z23 Encounter for immunization: Secondary | ICD-10-CM | POA: Diagnosis not present

## 2022-08-23 DIAGNOSIS — E538 Deficiency of other specified B group vitamins: Secondary | ICD-10-CM | POA: Diagnosis not present

## 2022-08-27 ENCOUNTER — Other Ambulatory Visit: Payer: Self-pay | Admitting: Internal Medicine

## 2022-08-27 DIAGNOSIS — Z1231 Encounter for screening mammogram for malignant neoplasm of breast: Secondary | ICD-10-CM

## 2022-08-30 DIAGNOSIS — M818 Other osteoporosis without current pathological fracture: Secondary | ICD-10-CM | POA: Diagnosis not present

## 2022-09-27 ENCOUNTER — Ambulatory Visit
Admission: RE | Admit: 2022-09-27 | Discharge: 2022-09-27 | Disposition: A | Discharge status: Home / Self Care | Payer: PPO | Source: Ambulatory Visit | Attending: Internal Medicine | Admitting: Internal Medicine

## 2022-09-27 DIAGNOSIS — Z1231 Encounter for screening mammogram for malignant neoplasm of breast: Secondary | ICD-10-CM | POA: Insufficient documentation

## 2022-11-29 DIAGNOSIS — E538 Deficiency of other specified B group vitamins: Secondary | ICD-10-CM | POA: Diagnosis not present

## 2022-12-01 DIAGNOSIS — M5116 Intervertebral disc disorders with radiculopathy, lumbar region: Secondary | ICD-10-CM | POA: Diagnosis not present

## 2022-12-01 DIAGNOSIS — E538 Deficiency of other specified B group vitamins: Secondary | ICD-10-CM | POA: Diagnosis not present

## 2022-12-14 DIAGNOSIS — J0100 Acute maxillary sinusitis, unspecified: Secondary | ICD-10-CM | POA: Diagnosis not present

## 2022-12-14 DIAGNOSIS — M5116 Intervertebral disc disorders with radiculopathy, lumbar region: Secondary | ICD-10-CM | POA: Diagnosis not present

## 2022-12-14 DIAGNOSIS — E538 Deficiency of other specified B group vitamins: Secondary | ICD-10-CM | POA: Diagnosis not present

## 2022-12-14 DIAGNOSIS — M5430 Sciatica, unspecified side: Secondary | ICD-10-CM | POA: Diagnosis not present

## 2023-01-12 DIAGNOSIS — H02883 Meibomian gland dysfunction of right eye, unspecified eyelid: Secondary | ICD-10-CM | POA: Diagnosis not present

## 2023-01-12 DIAGNOSIS — H5203 Hypermetropia, bilateral: Secondary | ICD-10-CM | POA: Diagnosis not present

## 2023-01-12 DIAGNOSIS — H3581 Retinal edema: Secondary | ICD-10-CM | POA: Diagnosis not present

## 2023-01-12 DIAGNOSIS — H43813 Vitreous degeneration, bilateral: Secondary | ICD-10-CM | POA: Diagnosis not present

## 2023-01-12 DIAGNOSIS — H02886 Meibomian gland dysfunction of left eye, unspecified eyelid: Secondary | ICD-10-CM | POA: Diagnosis not present

## 2023-01-12 DIAGNOSIS — H1045 Other chronic allergic conjunctivitis: Secondary | ICD-10-CM | POA: Diagnosis not present

## 2023-01-12 DIAGNOSIS — H2513 Age-related nuclear cataract, bilateral: Secondary | ICD-10-CM | POA: Diagnosis not present

## 2023-04-28 DIAGNOSIS — D2262 Melanocytic nevi of left upper limb, including shoulder: Secondary | ICD-10-CM | POA: Diagnosis not present

## 2023-04-28 DIAGNOSIS — L578 Other skin changes due to chronic exposure to nonionizing radiation: Secondary | ICD-10-CM | POA: Diagnosis not present

## 2023-04-28 DIAGNOSIS — D2261 Melanocytic nevi of right upper limb, including shoulder: Secondary | ICD-10-CM | POA: Diagnosis not present

## 2023-04-28 DIAGNOSIS — R208 Other disturbances of skin sensation: Secondary | ICD-10-CM | POA: Diagnosis not present

## 2023-04-28 DIAGNOSIS — D225 Melanocytic nevi of trunk: Secondary | ICD-10-CM | POA: Diagnosis not present

## 2023-04-28 DIAGNOSIS — L820 Inflamed seborrheic keratosis: Secondary | ICD-10-CM | POA: Diagnosis not present

## 2023-04-28 DIAGNOSIS — Z85828 Personal history of other malignant neoplasm of skin: Secondary | ICD-10-CM | POA: Diagnosis not present

## 2023-04-28 DIAGNOSIS — L538 Other specified erythematous conditions: Secondary | ICD-10-CM | POA: Diagnosis not present

## 2023-06-20 DIAGNOSIS — J439 Emphysema, unspecified: Secondary | ICD-10-CM | POA: Diagnosis not present

## 2023-06-20 DIAGNOSIS — R918 Other nonspecific abnormal finding of lung field: Secondary | ICD-10-CM | POA: Diagnosis not present

## 2023-06-20 DIAGNOSIS — J40 Bronchitis, not specified as acute or chronic: Secondary | ICD-10-CM | POA: Diagnosis not present

## 2023-06-24 DIAGNOSIS — J188 Other pneumonia, unspecified organism: Secondary | ICD-10-CM | POA: Diagnosis not present

## 2023-07-21 DIAGNOSIS — J189 Pneumonia, unspecified organism: Secondary | ICD-10-CM | POA: Diagnosis not present

## 2023-07-21 DIAGNOSIS — R918 Other nonspecific abnormal finding of lung field: Secondary | ICD-10-CM | POA: Diagnosis not present

## 2023-07-21 DIAGNOSIS — J439 Emphysema, unspecified: Secondary | ICD-10-CM | POA: Diagnosis not present

## 2023-07-22 DIAGNOSIS — J329 Chronic sinusitis, unspecified: Secondary | ICD-10-CM | POA: Diagnosis not present

## 2023-07-22 DIAGNOSIS — J3489 Other specified disorders of nose and nasal sinuses: Secondary | ICD-10-CM | POA: Diagnosis not present

## 2023-07-25 DIAGNOSIS — J3489 Other specified disorders of nose and nasal sinuses: Secondary | ICD-10-CM | POA: Diagnosis not present

## 2023-07-27 DIAGNOSIS — R220 Localized swelling, mass and lump, head: Secondary | ICD-10-CM | POA: Diagnosis not present

## 2023-07-27 DIAGNOSIS — J3489 Other specified disorders of nose and nasal sinuses: Secondary | ICD-10-CM | POA: Diagnosis not present

## 2023-08-18 DIAGNOSIS — M818 Other osteoporosis without current pathological fracture: Secondary | ICD-10-CM | POA: Diagnosis not present

## 2023-08-18 DIAGNOSIS — E782 Mixed hyperlipidemia: Secondary | ICD-10-CM | POA: Diagnosis not present

## 2023-08-18 DIAGNOSIS — E538 Deficiency of other specified B group vitamins: Secondary | ICD-10-CM | POA: Diagnosis not present

## 2023-08-25 ENCOUNTER — Ambulatory Visit
Admission: RE | Admit: 2023-08-25 | Discharge: 2023-08-25 | Disposition: A | Discharge status: Home / Self Care | Payer: PPO | Source: Ambulatory Visit | Attending: Internal Medicine | Admitting: Internal Medicine

## 2023-08-25 ENCOUNTER — Other Ambulatory Visit: Payer: Self-pay | Admitting: Internal Medicine

## 2023-08-25 DIAGNOSIS — Z23 Encounter for immunization: Secondary | ICD-10-CM | POA: Diagnosis not present

## 2023-08-25 DIAGNOSIS — R0609 Other forms of dyspnea: Secondary | ICD-10-CM | POA: Diagnosis not present

## 2023-08-25 DIAGNOSIS — E782 Mixed hyperlipidemia: Secondary | ICD-10-CM | POA: Diagnosis not present

## 2023-08-25 DIAGNOSIS — I700 Atherosclerosis of aorta: Secondary | ICD-10-CM | POA: Diagnosis not present

## 2023-08-25 DIAGNOSIS — J159 Unspecified bacterial pneumonia: Secondary | ICD-10-CM | POA: Diagnosis not present

## 2023-08-25 DIAGNOSIS — E538 Deficiency of other specified B group vitamins: Secondary | ICD-10-CM | POA: Diagnosis not present

## 2023-08-25 DIAGNOSIS — R9389 Abnormal findings on diagnostic imaging of other specified body structures: Secondary | ICD-10-CM | POA: Insufficient documentation

## 2023-08-25 DIAGNOSIS — R739 Hyperglycemia, unspecified: Secondary | ICD-10-CM | POA: Diagnosis not present

## 2023-08-25 DIAGNOSIS — Z0000 Encounter for general adult medical examination without abnormal findings: Secondary | ICD-10-CM

## 2023-08-25 DIAGNOSIS — R918 Other nonspecific abnormal finding of lung field: Secondary | ICD-10-CM | POA: Diagnosis not present

## 2023-08-25 MED ORDER — IOHEXOL 350 MG/ML SOLN
75.0000 mL | Freq: Once | INTRAVENOUS | Status: AC | PRN
  Administered 2023-08-25: 75 mL via INTRAVENOUS

## 2023-10-07 DIAGNOSIS — R0609 Other forms of dyspnea: Secondary | ICD-10-CM | POA: Diagnosis not present

## 2023-10-07 DIAGNOSIS — J849 Interstitial pulmonary disease, unspecified: Secondary | ICD-10-CM | POA: Diagnosis not present

## 2023-10-07 DIAGNOSIS — J984 Other disorders of lung: Secondary | ICD-10-CM | POA: Diagnosis not present

## 2023-10-07 DIAGNOSIS — I700 Atherosclerosis of aorta: Secondary | ICD-10-CM | POA: Diagnosis not present

## 2023-12-16 DIAGNOSIS — Z111 Encounter for screening for respiratory tuberculosis: Secondary | ICD-10-CM | POA: Diagnosis not present

## 2024-01-16 DIAGNOSIS — H43813 Vitreous degeneration, bilateral: Secondary | ICD-10-CM | POA: Diagnosis not present

## 2024-01-16 DIAGNOSIS — H2513 Age-related nuclear cataract, bilateral: Secondary | ICD-10-CM | POA: Diagnosis not present

## 2024-01-16 DIAGNOSIS — H5203 Hypermetropia, bilateral: Secondary | ICD-10-CM | POA: Diagnosis not present

## 2024-01-16 DIAGNOSIS — H1045 Other chronic allergic conjunctivitis: Secondary | ICD-10-CM | POA: Diagnosis not present

## 2024-08-21 DIAGNOSIS — R739 Hyperglycemia, unspecified: Secondary | ICD-10-CM | POA: Diagnosis not present

## 2024-08-21 DIAGNOSIS — E782 Mixed hyperlipidemia: Secondary | ICD-10-CM | POA: Diagnosis not present

## 2024-08-21 DIAGNOSIS — E538 Deficiency of other specified B group vitamins: Secondary | ICD-10-CM | POA: Diagnosis not present

## 2024-08-28 ENCOUNTER — Other Ambulatory Visit: Payer: Self-pay | Admitting: Internal Medicine

## 2024-08-28 DIAGNOSIS — J181 Lobar pneumonia, unspecified organism: Secondary | ICD-10-CM | POA: Diagnosis not present

## 2024-08-28 DIAGNOSIS — E782 Mixed hyperlipidemia: Secondary | ICD-10-CM | POA: Diagnosis not present

## 2024-08-28 DIAGNOSIS — R739 Hyperglycemia, unspecified: Secondary | ICD-10-CM | POA: Diagnosis not present

## 2024-08-28 DIAGNOSIS — Z79899 Other long term (current) drug therapy: Secondary | ICD-10-CM | POA: Diagnosis not present

## 2024-08-28 DIAGNOSIS — Z1231 Encounter for screening mammogram for malignant neoplasm of breast: Secondary | ICD-10-CM

## 2024-08-28 DIAGNOSIS — J849 Interstitial pulmonary disease, unspecified: Secondary | ICD-10-CM | POA: Diagnosis not present

## 2024-08-28 DIAGNOSIS — Z0000 Encounter for general adult medical examination without abnormal findings: Secondary | ICD-10-CM | POA: Diagnosis not present

## 2024-08-28 DIAGNOSIS — E538 Deficiency of other specified B group vitamins: Secondary | ICD-10-CM | POA: Diagnosis not present

## 2024-08-29 ENCOUNTER — Other Ambulatory Visit: Payer: Self-pay | Admitting: Internal Medicine

## 2024-08-29 DIAGNOSIS — Z0000 Encounter for general adult medical examination without abnormal findings: Secondary | ICD-10-CM

## 2024-08-29 DIAGNOSIS — J181 Lobar pneumonia, unspecified organism: Secondary | ICD-10-CM

## 2024-09-05 ENCOUNTER — Ambulatory Visit
Admission: RE | Admit: 2024-09-05 | Discharge: 2024-09-05 | Disposition: A | Discharge status: Home / Self Care | Source: Ambulatory Visit | Attending: Internal Medicine | Admitting: Internal Medicine

## 2024-09-05 DIAGNOSIS — I700 Atherosclerosis of aorta: Secondary | ICD-10-CM | POA: Diagnosis not present

## 2024-09-05 DIAGNOSIS — Z0000 Encounter for general adult medical examination without abnormal findings: Secondary | ICD-10-CM | POA: Insufficient documentation

## 2024-09-05 DIAGNOSIS — J181 Lobar pneumonia, unspecified organism: Secondary | ICD-10-CM | POA: Insufficient documentation

## 2024-09-05 DIAGNOSIS — R918 Other nonspecific abnormal finding of lung field: Secondary | ICD-10-CM | POA: Diagnosis not present

## 2024-09-05 MED ORDER — IOHEXOL 300 MG/ML  SOLN
75.0000 mL | Freq: Once | INTRAMUSCULAR | Status: AC | PRN
  Administered 2024-09-05: 75 mL via INTRAVENOUS

## 2024-09-18 DIAGNOSIS — J8410 Pulmonary fibrosis, unspecified: Secondary | ICD-10-CM | POA: Diagnosis not present

## 2024-09-18 DIAGNOSIS — E538 Deficiency of other specified B group vitamins: Secondary | ICD-10-CM | POA: Diagnosis not present

## 2024-09-18 DIAGNOSIS — F331 Major depressive disorder, recurrent, moderate: Secondary | ICD-10-CM | POA: Diagnosis not present

## 2024-10-01 DIAGNOSIS — H938X2 Other specified disorders of left ear: Secondary | ICD-10-CM | POA: Diagnosis not present

## 2024-10-01 DIAGNOSIS — J3489 Other specified disorders of nose and nasal sinuses: Secondary | ICD-10-CM | POA: Diagnosis not present

## 2024-10-01 DIAGNOSIS — H903 Sensorineural hearing loss, bilateral: Secondary | ICD-10-CM | POA: Diagnosis not present

## 2024-10-01 DIAGNOSIS — R0981 Nasal congestion: Secondary | ICD-10-CM | POA: Diagnosis not present

## 2024-10-10 DIAGNOSIS — T17500A Unspecified foreign body in bronchus causing asphyxiation, initial encounter: Secondary | ICD-10-CM | POA: Diagnosis not present

## 2024-10-10 DIAGNOSIS — J8410 Pulmonary fibrosis, unspecified: Secondary | ICD-10-CM | POA: Diagnosis not present

## 2024-10-10 DIAGNOSIS — J984 Other disorders of lung: Secondary | ICD-10-CM | POA: Diagnosis not present
